# Patient Record
Sex: Male | Born: 1970 | Race: White | Hispanic: No | Smoking: Never smoker
Health system: Southern US, Community
[De-identification: ages and names within clinical notes are randomized; demographics above are authoritative.]

## PROBLEM LIST (undated history)

## (undated) DIAGNOSIS — F411 Generalized anxiety disorder: Secondary | ICD-10-CM

## (undated) HISTORY — DX: Generalized anxiety disorder: F41.1

---

## 2007-03-02 ENCOUNTER — Encounter: Admission: RE | Admit: 2007-03-02 | Discharge: 2007-03-02 | Payer: Self-pay | Admitting: Family Medicine

## 2009-01-07 IMAGING — CT CT HEAD W/O CM
1 series · 16 of 28 positions shown, 20 images · IV contrast (agent unspecified)
Comparison: None

CLINICAL DATA: PICC top of head 1-1/2 weeks ago. Persistent dizziness, blurry
vision, and speech difficulty.
TECHNIQUE: 5mm collimated images were obtained from the base of the skull
through the vertex according to standard protocol without contrast.

HEAD CT WITHOUT CONTRAST:

[Series 32: 3d filtered head · axial · 0.49mm/px · z∈[+12,+141]mm · 16 of 28 slices shown, 20 images]
[im 2/28  brain]
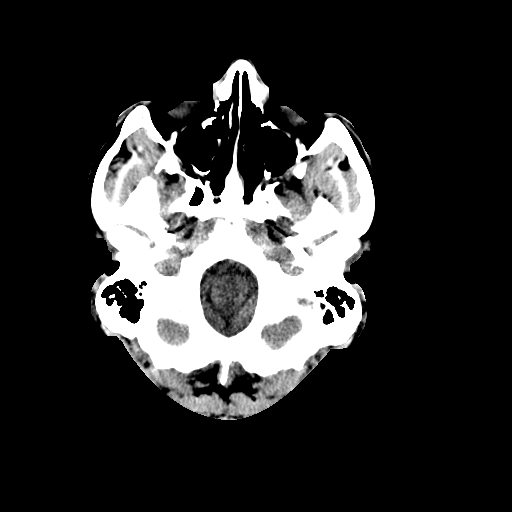
[im 2/28  bone]
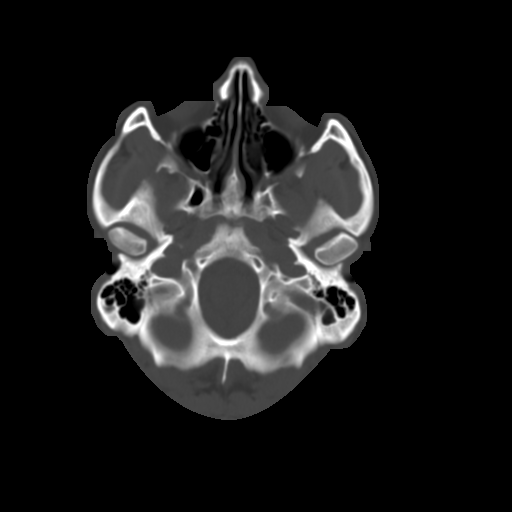
[im 4/28  brain]
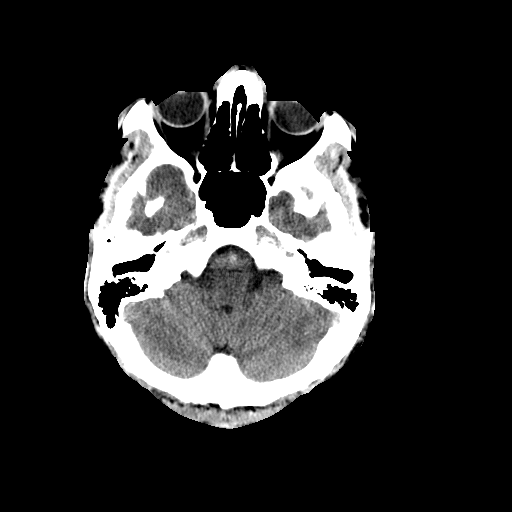
[im 6/28  brain]
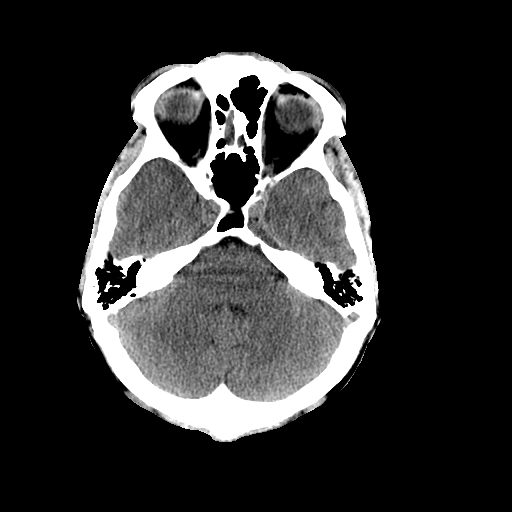
[im 7/28  brain]
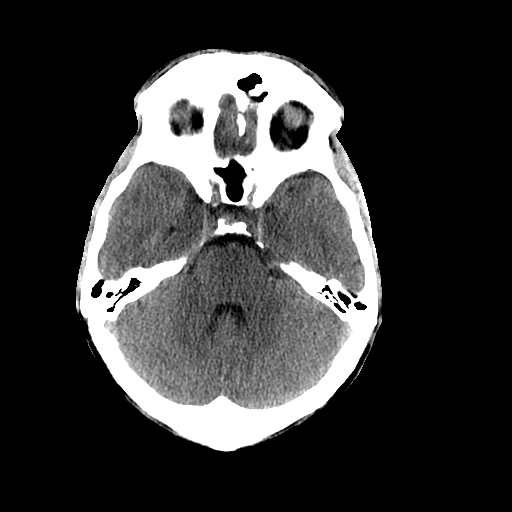
[im 9/28  brain]
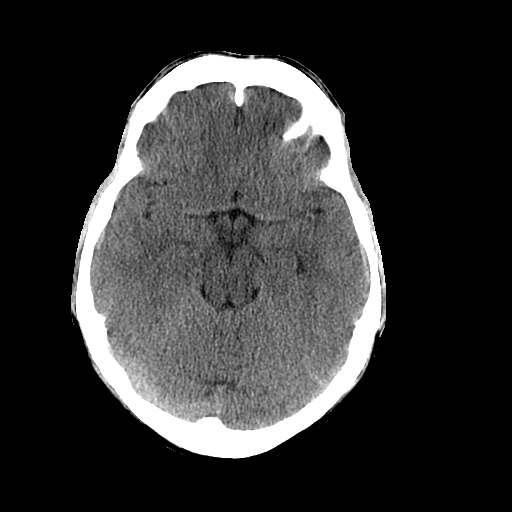
[im 9/28  bone]
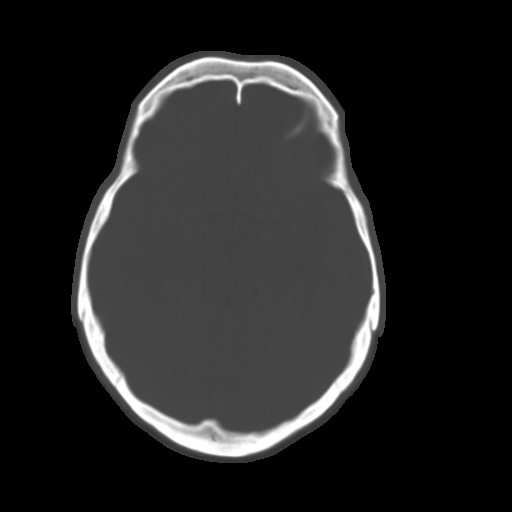
[im 10/28  brain]
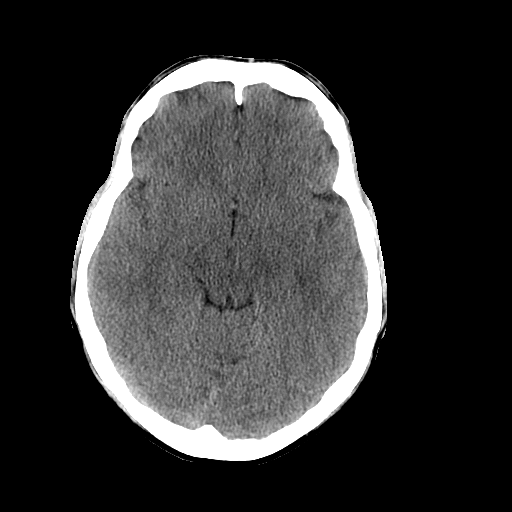
[im 12/28  brain]
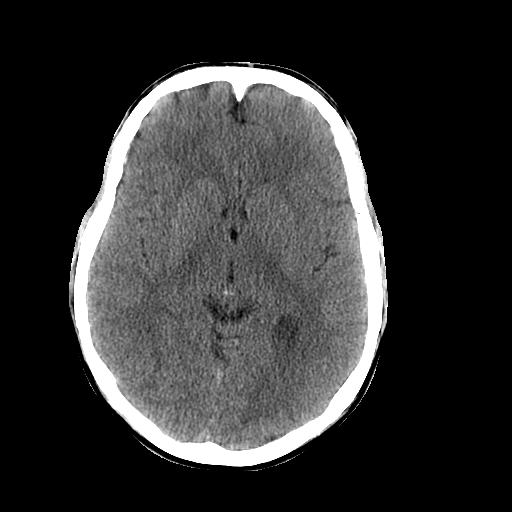
[im 14/28  brain]
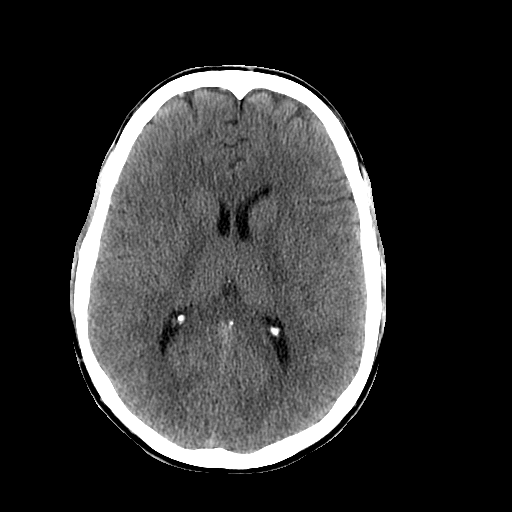
[im 15/28  brain]
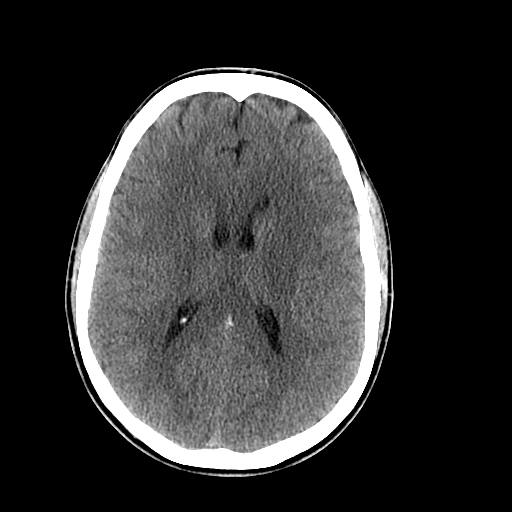
[im 15/28  bone]
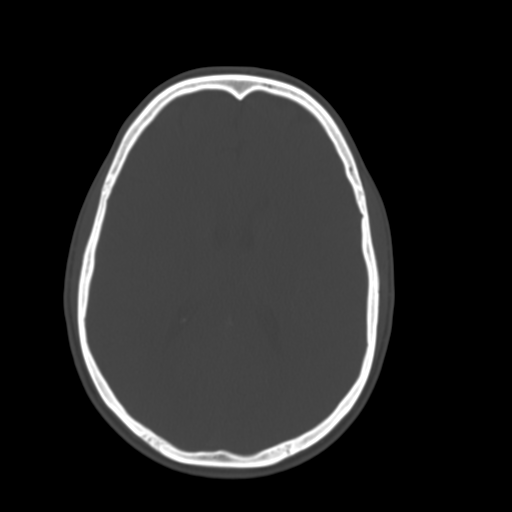
[im 17/28  brain]
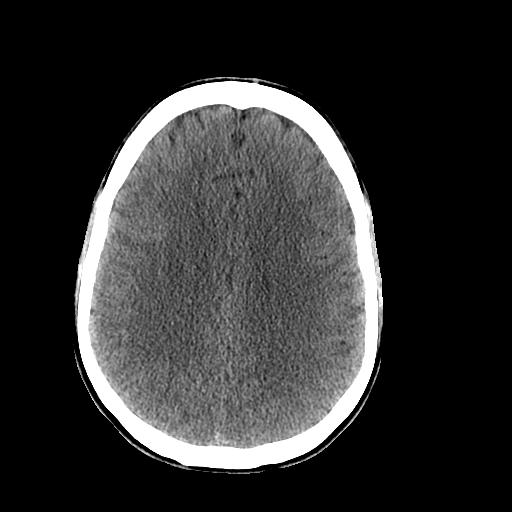
[im 19/28  brain]
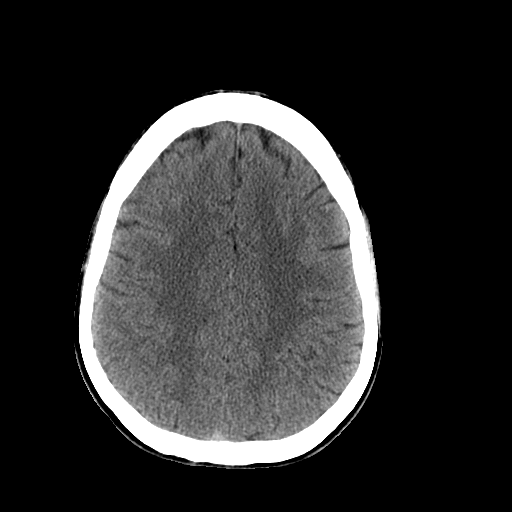
[im 20/28  brain]
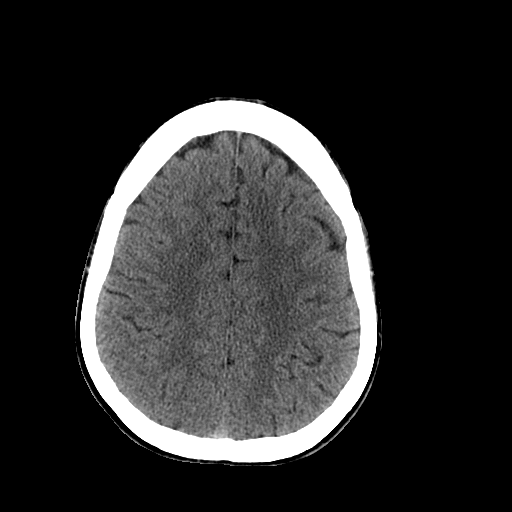
[im 22/28  brain]
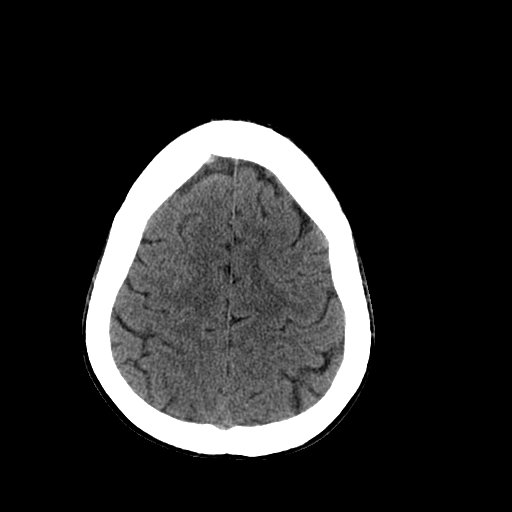
[im 22/28  bone]
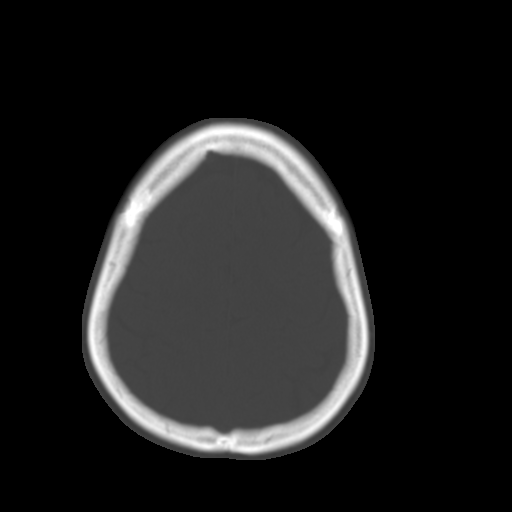
[im 23/28  brain]
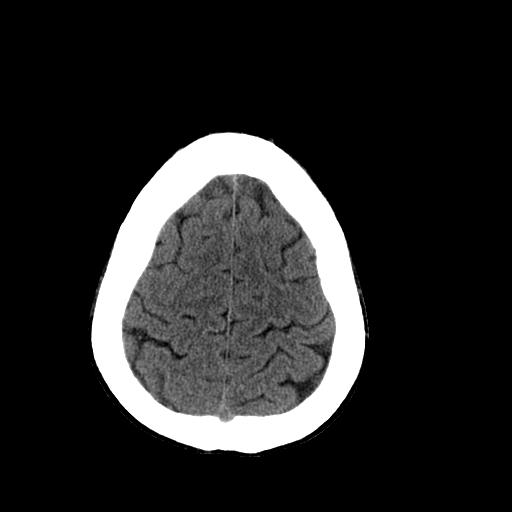
[im 25/28  brain]
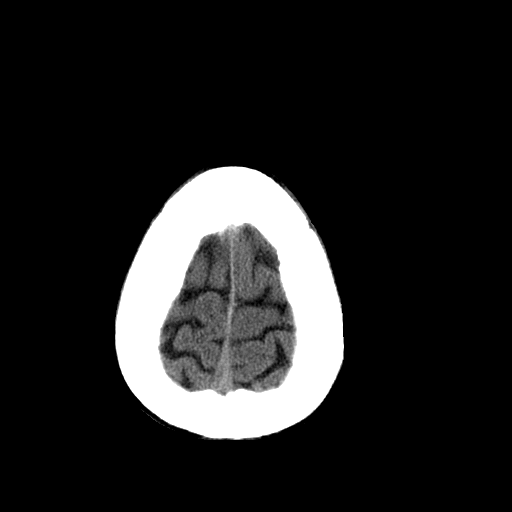
[im 27/28  brain]
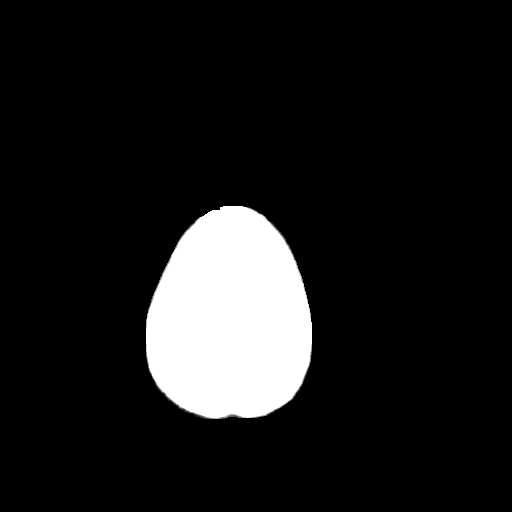

[16 of 28 positions shown; findings below may reference images not displayed]

FINDINGS: There is no evidence for acute hemorrhage, hydrocephalus, mass/mass-effect or
abnormal extra-axial fluid collection.  No definite CT evidence for acute
ischemia. Polypoid mucosal disease is seen in the left sphenoid sinus. The
remaining paranasal sinuses and mastoid air cells are clear.
IMPRESSION: No acute intracranial abnormality.

## 2013-11-17 ENCOUNTER — Ambulatory Visit (INDEPENDENT_AMBULATORY_CARE_PROVIDER_SITE_OTHER): Payer: BC Managed Care – PPO | Admitting: Family Medicine

## 2013-11-17 ENCOUNTER — Encounter: Payer: Self-pay | Admitting: Family Medicine

## 2013-11-17 VITALS — BP 147/89 | HR 77 | Ht 71.0 in | Wt 169.0 lb

## 2013-11-17 DIAGNOSIS — R030 Elevated blood-pressure reading, without diagnosis of hypertension: Secondary | ICD-10-CM

## 2013-11-17 DIAGNOSIS — Z13220 Encounter for screening for lipoid disorders: Secondary | ICD-10-CM

## 2013-11-17 DIAGNOSIS — Z131 Encounter for screening for diabetes mellitus: Secondary | ICD-10-CM

## 2013-11-17 DIAGNOSIS — L819 Disorder of pigmentation, unspecified: Secondary | ICD-10-CM

## 2013-11-17 DIAGNOSIS — IMO0001 Reserved for inherently not codable concepts without codable children: Secondary | ICD-10-CM

## 2013-11-17 DIAGNOSIS — F411 Generalized anxiety disorder: Secondary | ICD-10-CM

## 2013-11-17 HISTORY — DX: Generalized anxiety disorder: F41.1

## 2013-11-17 NOTE — Progress Notes (Signed)
CC: John CornDavid S Pollard is a 43 y.o. male is here for Establish Care   Subjective: HPI:  Very pleasant 43 year old here to establish care  Reports a history of anxiety that has been present for the past 5 years. Is described as mild in severity. It began after he consumed a large amount of beets and had what he thought was blood in his stool. Since then he would get occasional episodes of subjective anxiety however he is able to control this with yoga, and mindfulness meditation.  Symptoms are not currently interfering with quality of life  He has some growths on his face and the body that he would like to be evaluated by a dermatologist. No family or personal history of skin cancer. He's noticed some more pigmentation to some of growths he been present over the past 5 years but enlarging on a yearly basis. No interventions as of yet  Review of Systems - General ROS: negative for - chills, fever, night sweats, weight gain or weight loss Ophthalmic ROS: negative for - decreased vision Psychological ROS: negative for - anxiety or depression ENT ROS: negative for - hearing change, nasal congestion, tinnitus or allergies Hematological and Lymphatic ROS: negative for - bleeding problems, bruising or swollen lymph nodes Breast ROS: negative Respiratory ROS: no cough, shortness of breath, or wheezing Cardiovascular ROS: no chest pain or dyspnea on exertion Gastrointestinal ROS: no abdominal pain, change in bowel habits, or black or bloody stools Genito-Urinary ROS: negative for - genital discharge, genital ulcers, incontinence or abnormal bleeding from genitals Musculoskeletal ROS: negative for - joint pain or muscle pain Neurological ROS: negative for - headaches or memory loss Dermatological ROS: negative for lumps, mole changes, rash and skin lesion changes other than that described above  Past Medical History  Diagnosis Date  . Generalized anxiety disorder 11/17/2013    History reviewed. No  pertinent past surgical history. Family History  Problem Relation Age of Onset  . Heart attack Father   . Hyperlipidemia Father   . Hypertension Father     History   Social History  . Marital Status: Married    Spouse Name: N/A    Number of Children: N/A  . Years of Education: N/A   Occupational History  . Not on file.   Social History Main Topics  . Smoking status: Never Smoker   . Smokeless tobacco: Not on file  . Alcohol Use: 5.0 oz/week    10 drink(s) per week  . Drug Use: No  . Sexual Activity: Yes    Partners: Female   Other Topics Concern  . Not on file   Social History Narrative  . No narrative on file     Objective: BP 147/89  Pulse 77  Ht 5\' 11"  (1.803 m)  Wt 169 lb (76.658 kg)  BMI 23.58 kg/m2  General: Alert and Oriented, No Acute Distress HEENT: Pupils equal, round, reactive to light. Conjunctivae clear.  External ears unremarkable, canals clear with intact TMs with appropriate landmarks.  Middle ear appears open without effusion. Pink inferior turbinates.  Moist mucous membranes, pharynx without inflammation nor lesions.  Neck supple without palpable lymphadenopathy nor abnormal masses. Lungs: Clear to auscultation bilaterally, no wheezing/ronchi/rales.  Comfortable work of breathing. Good air movement. Cardiac: Regular rate and rhythm. Normal S1/S2.  No murmurs, rubs, nor gallops.   Extremities: No peripheral edema.  Strong peripheral pulses.  Mental Status: No depression, anxiety, nor agitation. Skin: Warm and dry. Noninflamed skin tags underneath the armpits, 2  flesh-colored growths on the face one above the left lip one just beneath the right ankle of the mandible this is the only one that appears to have irregular pigment pattern  Assessment & Plan: John Pollard was seen today for establish care.  Diagnoses and associated orders for this visit:  Elevated blood pressure - BASIC METABOLIC PANEL WITH GFR  Pigmented skin lesion - Ambulatory referral  to Dermatology  Lipid screening - Lipid panel  Diabetes mellitus screening - BASIC METABOLIC PANEL WITH GFR  Generalized anxiety disorder    Elevated blood pressure: Discussed DASH diet and physical activity to help produce the need for blood pressure medication in the near future. After he was resting a repeat blood pressure showed 130/70. Checking renal function Pigmented skin lesion: Dermatology referral per his request Generalized anxiety disorder: Controlled with lifestyle and exercise interventions no further interventions Due for routine dyslipidemia screening with a family history of cardiac disease    Return in about 1 year (around 11/18/2014).

## 2013-11-24 LAB — LIPID PANEL
CHOLESTEROL: 166 mg/dL (ref 0–200)
HDL: 57 mg/dL (ref >39)
LDL Cholesterol: 98 mg/dL (ref 0–99)
Total CHOL/HDL Ratio: 2.9 Ratio
Triglycerides: 55 mg/dL (ref <150)
VLDL: 11 mg/dL (ref 0–40)

## 2013-11-24 LAB — BASIC METABOLIC PANEL WITH GFR
BUN: 10 mg/dL (ref 6–23)
CO2: 27 mEq/L (ref 19–32)
Calcium: 8.8 mg/dL (ref 8.4–10.5)
Chloride: 103 mEq/L (ref 96–112)
Creat: 0.86 mg/dL (ref 0.50–1.35)
GFR, Est Non African American: >89 mL/min
Glucose, Bld: 85 mg/dL (ref 70–99)
POTASSIUM: 4 meq/L (ref 3.5–5.3)
Sodium: 139 mEq/L (ref 135–145)

## 2013-11-28 ENCOUNTER — Encounter: Payer: Self-pay | Admitting: Family Medicine

## 2013-12-14 ENCOUNTER — Encounter: Payer: Self-pay | Admitting: Family Medicine

## 2013-12-14 DIAGNOSIS — L989 Disorder of the skin and subcutaneous tissue, unspecified: Secondary | ICD-10-CM | POA: Insufficient documentation

## 2019-07-10 ENCOUNTER — Emergency Department (INDEPENDENT_AMBULATORY_CARE_PROVIDER_SITE_OTHER)
Admission: EM | Admit: 2019-07-10 | Discharge: 2019-07-10 | Disposition: A | Discharge status: Home / Self Care | Payer: 59 | Source: Home / Self Care | Attending: Emergency Medicine | Admitting: Emergency Medicine

## 2019-07-10 ENCOUNTER — Other Ambulatory Visit: Payer: Self-pay

## 2019-07-10 DIAGNOSIS — S0502XA Injury of conjunctiva and corneal abrasion without foreign body, left eye, initial encounter: Secondary | ICD-10-CM | POA: Diagnosis not present

## 2019-07-10 MED ORDER — GENTAMICIN SULFATE 0.3 % OP SOLN: 2.0000 [drp] | Freq: Four times a day (QID) | OPHTHALMIC | 0 refills | Status: DC

## 2019-07-10 NOTE — ED Provider Notes (Signed)
[facility] Pollard [facility]    CSN: [id number] Arrival date & time: 07/10/19  1546      History   Chief Complaint Chief Complaint  Patient presents with  . Eye Problem    HPI John Pollard is a 49 y.o. male.   HPI Complains of left eye pain and foreign body sensation when he blinks.  Started yesterday after accidentally poking left eye with a branch while doing yard work.  He feels it is constantly watering.  Pain can vary between 4 and 7 out of 10.  He feels vision in the left eye is slightly decreased because of watering but otherwise can see fairly well.  Denies any scotomas or problems with field of vision.  Denies any other head injury or other ENT symptoms or any loss of consciousness or focal neurologic symptoms. Review of systems otherwise negative. Denies prior eye problems.  No known drug allergies. Past Medical History:  Diagnosis Date  . Generalized anxiety disorder 11/17/2013    Patient Active Problem List   Diagnosis Date Noted  . Skin lesion 12/14/2013  . Generalized anxiety disorder 11/17/2013  . Elevated blood pressure 11/17/2013    History reviewed. No pertinent surgical history.     Home Medications    Prior to Admission medications   Medication Sig Start Date End Date Taking? Authorizing Provider  gentamicin (GARAMYCIN) 0.3 % ophthalmic solution Place 2 drops into the left eye 4 (four) times daily. X 7 days 07/10/19   Lajean Manes, MD    Family History Family History  Problem Relation Age of Onset  . Heart attack Father   . Hyperlipidemia Father   . Hypertension Father     Social History Social History   Tobacco Use  . Smoking status: Never Smoker  Substance Use Topics  . Alcohol use: Yes    Alcohol/week: 10.0 standard drinks    Types: 10 Standard drinks or equivalent per week  . Drug use: No     Allergies   Patient has no known allergies.   Review of Systems Review of Systems Pertinent items noted in HPI and remainder of  comprehensive ROS otherwise negative.   Physical Exam Triage Vital Signs ED Triage Vitals  Enc Vitals Group     BP 07/10/19 1601 (!) 161/89     Pulse Rate 07/10/19 1601 76     Resp 07/10/19 1601 18     Temp 07/10/19 1601 98.5 F (36.9 C)     Temp Source 07/10/19 1601 Oral     SpO2 07/10/19 1601 98 %     Weight 07/10/19 1602 170 lb (77.1 kg)     Height 07/10/19 1602 5\' 11"  (1.803 m)     Head Circumference --      Peak Flow --      Pain Score 07/10/19 1602 7     Pain Loc --      Pain Edu? --      Excl. in GC? --    No data found.  Updated Vital Signs BP (!) 161/89 (BP Location: Left Arm)   Pulse 76   Temp 98.5 F (36.9 C) (Oral)   Resp 18   Ht 5\' 11"  (1.803 m)   Wt 77.1 kg   SpO2 98%   BMI 23.71 kg/m   Visual Acuity Right Eye Distance:   20/15 Left Eye Distance:   20/30 Bilateral Distance:   20/20  Right Eye Near:    Left Eye Near:    Bilateral Near:  Physical Exam Vitals reviewed.  Constitutional:      General: He is not in acute distress (But uncomfortable from left eye pain).    Appearance: He is well-developed.  HENT:     Head: Normocephalic and atraumatic.     Mouth/Throat:     Pharynx: Oropharynx is clear.  Eyes:     General: Lids are normal. Lids are everted, no foreign bodies appreciated. No visual field deficit.       Right eye: No foreign body or discharge.        Left eye: No foreign body or discharge.     Extraocular Movements: Extraocular movements intact.     Conjunctiva/sclera:     Right eye: Right conjunctiva is not injected.     Left eye: Left conjunctiva is injected. No exudate or hemorrhage.    Pupils: Pupils are equal, round, and reactive to light.     Funduscopic exam:    Right eye: No papilledema.        Left eye: No papilledema.     Slit lamp exam:    Right eye: Anterior chamber quiet.     Left eye: Anterior chamber quiet.      Comments: 2 drops of tetracaine placed left eye with good anesthesia.  He stated his eye pain  improved after the tetracaine.   Using saline soaked Q-tip, lids everted, no foreign body seen or retrieved. Fluorescein stain showed deep corneal abrasion at 6:00 left eye.-I then irrigated with saline to remove the stain. Erythromycin ointment and double eye patch applied, secured with tape.-He tolerated all the above well and stated to his L eye pain was better .   Cardiovascular:     Rate and Rhythm: Normal rate and regular rhythm.  Pulmonary:     Effort: Pulmonary effort is normal.  Abdominal:     General: There is no distension.  Musculoskeletal:     Cervical back: Normal range of motion and neck supple.  Skin:    General: Skin is warm and dry.  Neurological:     Mental Status: He is alert and oriented to person, place, and time.     Cranial Nerves: No cranial nerve deficit.  Psychiatric:        Behavior: Behavior normal.      UC Treatments / Results  Labs (all labs ordered are listed, but only abnormal results are displayed) Labs Reviewed - No data to display  EKG   Radiology No results found.  Procedures Procedures (including critical care time)  Medications Ordered in UC Medications - No data to display  Initial Impression / Assessment and Plan / UC Course  I have reviewed the triage vital signs and the nursing notes.  Pertinent labs & imaging results that were available during my care of the patient were reviewed by me and considered in my medical decision making (see chart for details).     MDM: I explained the vital importance of following up with eye doctor in the next 1 to 2 days, as a deep corneal abrasion would be at risk for permanent vision problems if it was not allowed to heal properly. Questions invited and answered at length.  See discharge instructions below, given to patient.  He voiced understanding and agreement with plans.  Advised not to drive with patch on eye.  He called his wife who came to urgent care to drive him home.  Final  Clinical Impressions(s) / UC Diagnoses   Final diagnoses:  Abrasion of left  cornea, initial encounter     Discharge Instructions     You have a corneal abrasion left eye. Today, I examined your left eye including staining with fluorescein, which highlighted the corneal abrasion ,left eye. Then, I irrigated with saline.  No foreign body seen. I then placed erythromycin ointment and a double eye patch. Wear the L eye patch tonight, then remove tomorrow morning.-It is best for you not to drive with patch on your eye. I sent a prescription for eyedrops to your pharmacy to use 2 drops in the left eye 4 times a day starting tomorrow. You need to follow-up with eye doctor in 1 to 2 days to recheck to be sure that it is improved. Please also see attached instruction sheet on corneal abrasion.  I gave you a handout with the name and contact information of various eye doctors in [place].     ED Prescriptions    Medication Sig Dispense Auth. Provider   gentamicin (GARAMYCIN) 0.3 % ophthalmic solution Place 2 drops into the left eye 4 (four) times daily. X 7 days 5 mL Lajean Manes, MD      also advised to have BP rechecked with PCP   Lajean Manes, MD 07/10/19 2221

## 2019-07-10 NOTE — ED Triage Notes (Addendum)
Pt c/o LT eye swelling and redness. Says he poked it with a branch yesterday while doing yardwork. Also constant watering. Pain 7/10

## 2019-07-10 NOTE — Discharge Instructions (Addendum)
You have a corneal abrasion left eye. Today, I examined your left eye including staining with fluorescein, which highlighted the corneal abrasion ,left eye. Then, I irrigated with saline.  No foreign body seen. I then placed erythromycin ointment and a double eye patch. Wear the L eye patch tonight, then remove tomorrow morning.-It is best for you not to drive with patch on your eye. I sent a prescription for eyedrops to your pharmacy to use 2 drops in the left eye 4 times a day starting tomorrow. You need to follow-up with eye doctor in 1 to 2 days to recheck to be sure that it is improved. Please also see attached instruction sheet on corneal abrasion.  I gave you a handout with the name and contact information of various eye doctors in [place].

## 2020-08-06 ENCOUNTER — Ambulatory Visit: Payer: 59 | Admitting: Family Medicine

## 2020-08-13 ENCOUNTER — Ambulatory Visit (INDEPENDENT_AMBULATORY_CARE_PROVIDER_SITE_OTHER): Payer: 59 | Admitting: Family Medicine

## 2020-08-13 ENCOUNTER — Encounter: Payer: Self-pay | Admitting: Family Medicine

## 2020-08-13 ENCOUNTER — Other Ambulatory Visit: Payer: Self-pay

## 2020-08-13 VITALS — BP 147/95 | HR 68 | Temp 97.6°F | Ht 70.28 in | Wt 173.2 lb

## 2020-08-13 DIAGNOSIS — Z13220 Encounter for screening for lipoid disorders: Secondary | ICD-10-CM | POA: Diagnosis not present

## 2020-08-13 DIAGNOSIS — Z1211 Encounter for screening for malignant neoplasm of colon: Secondary | ICD-10-CM

## 2020-08-13 DIAGNOSIS — B360 Pityriasis versicolor: Secondary | ICD-10-CM | POA: Diagnosis not present

## 2020-08-13 DIAGNOSIS — Z0000 Encounter for general adult medical examination without abnormal findings: Secondary | ICD-10-CM | POA: Diagnosis not present

## 2020-08-13 MED ORDER — KETOCONAZOLE 2 % EX CREA: 1.0000 "application " | TOPICAL_CREAM | Freq: Every day | CUTANEOUS | 0 refills | Status: AC

## 2020-08-13 NOTE — Patient Instructions (Signed)

## 2020-08-13 NOTE — Assessment & Plan Note (Signed)
Well adult Orders Placed This Encounter  Procedures  . COMPLETE METABOLIC PANEL WITH GFR  . CBC with Differential  . Lipid Panel w/reflex Direct LDL  . Ambulatory referral to Gastroenterology    Referral Priority:   Routine    Referral Type:   Consultation    Referral Reason:   Specialty Services Required    Number of Visits Requested:   1  Screening:  Colonoscopy referral.  Immunizations: He thinks he may be up to date on Tdap, will hold off on this for now.  Anticipatory guidance/risk factor reduction:  Recommendations per AVS.

## 2020-08-13 NOTE — Progress Notes (Signed)
John Pollard - 50 y.o. male MRN [id number]  Date of birth: 10/12/1970  Subjective Chief Complaint  Patient presents with  . Establish Care    HPI John Pollard is a 50 y.o. male here today for initial visit and annual exam.  He has been in good health.  He denies any chronic medical problems and does not take any regular medications.   He has noted rash on his chest that he would like checked out.  Consists of some discolored patches.  Has some mild itchiness at times.    He has never had colon cancer screening.  He would like referral for colonoscopy.   He does exercise regularly and feels like his diet is pretty good.   He would like to have updated labs today.   Review of Systems  Constitutional: Negative for chills, fever, malaise/fatigue and weight loss.  HENT: Negative for congestion, ear pain and sore throat.   Eyes: Negative for blurred vision, double vision and pain.  Respiratory: Negative for cough and shortness of breath.   Cardiovascular: Negative for chest pain and palpitations.  Gastrointestinal: Negative for abdominal pain, blood in stool, constipation, heartburn and nausea.  Genitourinary: Negative for dysuria and urgency.  Musculoskeletal: Negative for joint pain and myalgias.  Neurological: Negative for dizziness and headaches.  Endo/Heme/Allergies: Does not bruise/bleed easily.  Psychiatric/Behavioral: Negative for depression. The patient is not nervous/anxious and does not have insomnia.     No Known Allergies  Past Medical History:  Diagnosis Date  . Generalized anxiety disorder 11/17/2013    History reviewed. No pertinent surgical history.  Social History   Socioeconomic History  . Marital status: Married    Spouse name: Not on file  . Number of children: Not on file  . Years of education: Not on file  . Highest education level: Not on file  Occupational History  . Not on file  Tobacco Use  . Smoking status: Never Smoker  . Smokeless  tobacco: Never Used  Vaping Use  . Vaping Use: Never used  Substance and Sexual Activity  . Alcohol use: Yes    Alcohol/week: 12.0 standard drinks    Types: 12 Cans of beer per week  . Drug use: No  . Sexual activity: Yes    Partners: Female  Other Topics Concern  . Not on file  Social History Narrative  . Not on file   Social Determinants of Health   Financial Resource Strain: Not on file  Food Insecurity: Not on file  Transportation Needs: Not on file  Physical Activity: Not on file  Stress: Not on file  Social Connections: Not on file    Family History  Problem Relation Age of Onset  . Heart attack Father   . Hyperlipidemia Father   . Hypertension Father   . Stroke Maternal Grandfather   . Stroke Paternal Uncle     Health Maintenance  Topic Date Due  . Hepatitis C Screening  Never done  . COVID-19 Vaccine (1) Never done  . HIV Screening  Never done  . COLONOSCOPY (Pts 45-45yrs Insurance coverage will need to be confirmed)  Never done  . TETANUS/TDAP  11/18/2018  . INFLUENZA VACCINE  12/09/2020  . HPV VACCINES  Aged Out     ----------------------------------------------------------------------------------------------------------------------------------------------------------------------------------------------------------------- Physical Exam BP (!) 147/95 (BP Location: Left Arm, Patient Position: Sitting, Cuff Size: Normal)   Pulse 68   Temp 97.6 F (36.4 C)   Ht 5' 10.28" (1.785 m)   Wt 173  lb 3.2 oz (78.6 kg)   SpO2 97%   BMI 24.66 kg/m   Physical Exam Constitutional:      General: He is not in acute distress. HENT:     Head: Normocephalic and atraumatic.     Right Ear: External ear normal.     Left Ear: External ear normal.  Eyes:     General: No scleral icterus. Neck:     Thyroid: No thyromegaly.  Cardiovascular:     Rate and Rhythm: Normal rate and regular rhythm.     Heart sounds: Normal heart sounds.  Pulmonary:     Effort: Pulmonary  effort is normal.     Breath sounds: Normal breath sounds.  Abdominal:     General: Bowel sounds are normal. There is no distension.     Palpations: Abdomen is soft.     Tenderness: There is no abdominal tenderness. There is no guarding.  Musculoskeletal:     Cervical back: Normal range of motion.     Comments: A few scattered hyperpigmented macules on chest  Lymphadenopathy:     Cervical: No cervical adenopathy.  Skin:    General: Skin is warm and dry.     Findings: No rash.  Neurological:     Mental Status: He is alert and oriented to person, place, and time.     Cranial Nerves: No cranial nerve deficit.     Motor: No abnormal muscle tone.  Psychiatric:        Behavior: Behavior normal.     ------------------------------------------------------------------------------------------------------------------------------------------------------------------------------------------------------------------- Assessment and Plan  Tinea versicolor Rash on chest consistent with tinea versicolor.  Start topical ketoconazole daily x2 weeks.    Well adult exam Well adult Orders Placed This Encounter  Procedures  . COMPLETE METABOLIC PANEL WITH GFR  . CBC with Differential  . Lipid Panel w/reflex Direct LDL  . Ambulatory referral to Gastroenterology    Referral Priority:   Routine    Referral Type:   Consultation    Referral Reason:   Specialty Services Required    Number of Visits Requested:   1  Screening:  Colonoscopy referral.  Immunizations: He thinks he may be up to date on Tdap, will hold off on this for now.  Anticipatory guidance/risk factor reduction:  Recommendations per AVS.     Meds ordered this encounter  Medications  . ketoconazole (NIZORAL) 2 % cream    Sig: Apply 1 application topically daily for 14 days.    Dispense:  14 g    Refill:  0    No follow-ups on file.    This visit occurred during the SARS-CoV-2 public health emergency.  Safety protocols were in  place, including screening questions prior to the visit, additional usage of staff PPE, and extensive cleaning of exam room while observing appropriate contact time as indicated for disinfecting solutions.

## 2020-08-13 NOTE — Assessment & Plan Note (Signed)
Rash on chest consistent with tinea versicolor.  Start topical ketoconazole daily x2 weeks.

## 2020-08-14 LAB — CBC WITH DIFFERENTIAL/PLATELET
Eosinophils Relative: 0.6 %
MCHC: 32.6 g/dL (ref 32.0–36.0)

## 2020-08-15 LAB — CBC WITH DIFFERENTIAL/PLATELET
Absolute Monocytes: 536 cells/uL (ref 200–950)
Basophils Absolute: 28 cells/uL (ref 0–200)
Basophils Relative: 0.6 %
Eosinophils Absolute: 28 cells/uL (ref 15–500)
HCT: 47.2 % (ref 38.5–50.0)
Hemoglobin: 15.4 g/dL (ref 13.2–17.1)
Lymphs Abs: 649 cells/uL — ABNORMAL LOW (ref 850–3900)
MCH: 29.7 pg (ref 27.0–33.0)
MCV: 91.1 fL (ref 80.0–100.0)
MPV: 12.2 fL (ref 7.5–12.5)
Monocytes Relative: 11.4 %
Neutro Abs: 3459 cells/uL (ref 1500–7800)
Neutrophils Relative %: 73.6 %
Platelets: 229 10*3/uL (ref 140–400)
RBC: 5.18 10*6/uL (ref 4.20–5.80)
RDW: 12.2 % (ref 11.0–15.0)
Total Lymphocyte: 13.8 %
WBC: 4.7 10*3/uL (ref 3.8–10.8)

## 2020-08-15 LAB — COMPLETE METABOLIC PANEL WITH GFR
AG Ratio: 1.6 (calc) (ref 1.0–2.5)
ALT: 16 U/L (ref 9–46)
AST: 21 U/L (ref 10–35)
Albumin: 4.6 g/dL (ref 3.6–5.1)
Alkaline phosphatase (APISO): 51 U/L (ref 35–144)
BUN: 16 mg/dL (ref 7–25)
CO2: 27 mmol/L (ref 20–32)
Calcium: 9.6 mg/dL (ref 8.6–10.3)
Chloride: 102 mmol/L (ref 98–110)
Creat: 0.89 mg/dL (ref 0.70–1.33)
GFR, Est African American: 116 mL/min/{1.73_m2} (ref >=60)
GFR, Est Non African American: 100 mL/min/{1.73_m2} (ref >=60)
Globulin: 2.8 g/dL (calc) (ref 1.9–3.7)
Glucose, Bld: 92 mg/dL (ref 65–99)
Potassium: 4.3 mmol/L (ref 3.5–5.3)
Sodium: 139 mmol/L (ref 135–146)
Total Bilirubin: 1.4 mg/dL — ABNORMAL HIGH (ref 0.2–1.2)
Total Protein: 7.4 g/dL (ref 6.1–8.1)

## 2020-08-15 LAB — LIPID PANEL W/REFLEX DIRECT LDL
Cholesterol: 205 mg/dL — ABNORMAL HIGH (ref <200)
HDL: 60 mg/dL (ref >=40)
LDL Cholesterol (Calc): 129 mg/dL (calc) — ABNORMAL HIGH
Non-HDL Cholesterol (Calc): 145 mg/dL (calc) — ABNORMAL HIGH (ref <130)
Total CHOL/HDL Ratio: 3.4 (calc) (ref <5.0)
Triglycerides: 66 mg/dL (ref <150)

## 2020-09-18 LAB — HM COLONOSCOPY

## 2020-10-03 ENCOUNTER — Encounter: Payer: Self-pay | Admitting: Family Medicine

## 2020-10-03 ENCOUNTER — Other Ambulatory Visit: Payer: Self-pay

## 2021-10-02 ENCOUNTER — Ambulatory Visit (INDEPENDENT_AMBULATORY_CARE_PROVIDER_SITE_OTHER): Payer: 59 | Admitting: Family Medicine

## 2021-10-02 ENCOUNTER — Encounter: Payer: Self-pay | Admitting: Family Medicine

## 2021-10-02 VITALS — BP 155/77 | HR 59 | Ht 70.28 in | Wt 173.0 lb

## 2021-10-02 DIAGNOSIS — R6882 Decreased libido: Secondary | ICD-10-CM

## 2021-10-02 DIAGNOSIS — Z13220 Encounter for screening for lipoid disorders: Secondary | ICD-10-CM | POA: Diagnosis not present

## 2021-10-02 DIAGNOSIS — Z23 Encounter for immunization: Secondary | ICD-10-CM | POA: Diagnosis not present

## 2021-10-02 DIAGNOSIS — R030 Elevated blood-pressure reading, without diagnosis of hypertension: Secondary | ICD-10-CM

## 2021-10-02 DIAGNOSIS — Z0000 Encounter for general adult medical examination without abnormal findings: Secondary | ICD-10-CM | POA: Diagnosis not present

## 2021-10-02 DIAGNOSIS — Z125 Encounter for screening for malignant neoplasm of prostate: Secondary | ICD-10-CM

## 2021-10-02 DIAGNOSIS — N529 Male erectile dysfunction, unspecified: Secondary | ICD-10-CM

## 2021-10-02 MED ORDER — TADALAFIL 20 MG PO TABS: 10.0000 mg | ORAL_TABLET | ORAL | 11 refills | Status: AC | PRN

## 2021-10-02 NOTE — Assessment & Plan Note (Signed)
Well adult Orders Placed This Encounter  Procedures  . Tdap vaccine greater than or equal to 52yo IM  . COMPLETE METABOLIC PANEL WITH GFR  . CBC with Differential  . Lipid Panel w/reflex Direct LDL  . TSH  . Testosterone  . PSA  Screening:  Per lab orders Immunizations: Tdap given today. He will get Shingrix at his local pharmacy.  Anticipatory guidance/Risk factor reduction:  Recommendations per AVS.

## 2021-10-02 NOTE — Assessment & Plan Note (Signed)
Checking testosterone and PSA.  Trial of tadalafil sent.  Side effects and red flags reviewed.

## 2021-10-02 NOTE — Patient Instructions (Signed)

## 2021-10-02 NOTE — Progress Notes (Signed)
John Pollard - 51 y.o. male MRN [id number]  Date of birth: [birth year]  Subjective Chief Complaint  Patient presents with   Annual Exam    HPI John Pollard is a 51 y.o. male here today for annual exam.  Overall he has been in good health.  Is in a new relationship and has noted some mild ED issues.  Interest and drive are normal.    Continues to stay pretty active.  Feels like diet is pretty good.   He denies EtOH use. He has a few beers each week.   He is due for Tdap and Shingrix.  He would like to go ahead and get Tdap today.     Review of Systems  Constitutional:  Negative for chills, fever, malaise/fatigue and weight loss.  HENT:  Negative for congestion, ear pain and sore throat.   Eyes:  Negative for blurred vision, double vision and pain.  Respiratory:  Negative for cough and shortness of breath.   Cardiovascular:  Negative for chest pain and palpitations.  Gastrointestinal:  Negative for abdominal pain, blood in stool, constipation, heartburn and nausea.  Genitourinary:  Negative for dysuria and urgency.  Musculoskeletal:  Negative for joint pain and myalgias.  Neurological:  Negative for dizziness and headaches.  Endo/Heme/Allergies:  Does not bruise/bleed easily.  Psychiatric/Behavioral:  Negative for depression. The patient is not nervous/anxious and does not have insomnia.    No Known Allergies  Past Medical History:  Diagnosis Date   Generalized anxiety disorder 11/17/2013    History reviewed. No pertinent surgical history.  Social History   Socioeconomic History   Marital status: Legally Separated    Spouse name: Not on file   Number of children: Not on file   Years of education: Not on file   Highest education level: Not on file  Occupational History   Not on file  Tobacco Use   Smoking status: Never   Smokeless tobacco: Never  Vaping Use   Vaping Use: Never used  Substance and Sexual Activity   Alcohol use: Yes    Alcohol/week: 12.0  standard drinks    Types: 12 Cans of beer per week   Drug use: No   Sexual activity: Yes    Partners: Female  Other Topics Concern   Not on file  Social History Narrative   Not on file   Social Determinants of Health   Financial Resource Strain: Not on file  Food Insecurity: Not on file  Transportation Needs: Not on file  Physical Activity: Not on file  Stress: Not on file  Social Connections: Not on file    Family History  Problem Relation Age of Onset   Heart attack Father    Hyperlipidemia Father    Hypertension Father    Stroke Maternal Grandfather    Stroke Paternal [place] Maintenance  Topic Date Due   TETANUS/TDAP  11/18/2018   COVID-19 Vaccine (4 - Booster for Moderna series) 01/09/2022 (Originally 05/22/2020)   Zoster Vaccines- Shingrix (1 of 2) 01/09/2022 (Originally 03/24/2020)   Hepatitis C Screening  10/03/2022 (Originally 03/24/1988)   HIV Screening  10/03/2022 (Originally 03/24/1985)   INFLUENZA VACCINE  12/09/2021   COLONOSCOPY (Pts 45-53yrs Insurance coverage will need to be confirmed)  09/19/2023   HPV VACCINES  Aged Out     ----------------------------------------------------------------------------------------------------------------------------------------------------------------------------------------------------------------- Physical Exam BP (!) 142/77 (BP Location: Left Arm, Patient Position: Sitting, Cuff Size: Small)   Pulse 68   Ht 5' 10.28" (1.785 m)  Wt 173 lb (78.5 kg)   SpO2 99%   BMI 24.63 kg/m   Physical Exam Constitutional:      General: He is not in acute distress. HENT:     Head: Normocephalic and atraumatic.     Right Ear: Tympanic membrane and external ear normal.     Left Ear: Tympanic membrane and external ear normal.  Eyes:     General: No scleral icterus. Neck:     Thyroid: No thyromegaly.  Cardiovascular:     Rate and Rhythm: Normal rate and regular rhythm.     Heart sounds: Normal heart sounds.   Pulmonary:     Effort: Pulmonary effort is normal.     Breath sounds: Normal breath sounds.  Abdominal:     General: Bowel sounds are normal. There is no distension.     Palpations: Abdomen is soft.     Tenderness: There is no abdominal tenderness. There is no guarding.  Musculoskeletal:     Cervical back: Normal range of motion.  Lymphadenopathy:     Cervical: No cervical adenopathy.  Skin:    General: Skin is warm and dry.     Findings: No rash.  Neurological:     Mental Status: He is alert and oriented to person, place, and time.     Cranial Nerves: No cranial nerve deficit.     Motor: No abnormal muscle tone.  Psychiatric:        Mood and Affect: Mood normal.        Behavior: Behavior normal.    ------------------------------------------------------------------------------------------------------------------------------------------------------------------------------------------------------------------- Assessment and Plan  Well adult exam Well adult Orders Placed This Encounter  Procedures   Tdap vaccine greater than or equal to 7yo IM   COMPLETE METABOLIC PANEL WITH GFR   CBC with Differential   Lipid Panel w/reflex Direct LDL   TSH   Testosterone   PSA  Screening:  Per lab orders Immunizations: Tdap given today. He will get Shingrix at his local pharmacy.  Anticipatory guidance/Risk factor reduction:  Recommendations per AVS.    ED (erectile dysfunction) Checking testosterone and PSA.  Trial of tadalafil sent.  Side effects and red flags reviewed.   Meds ordered this encounter  Medications   tadalafil (CIALIS) 20 MG tablet    Sig: Take 0.5-1 tablets (10-20 mg total) by mouth every other day as needed for erectile dysfunction.    Dispense:  10 tablet    Refill:  11    No follow-ups on file.    This visit occurred during the SARS-CoV-2 public health emergency.  Safety protocols were in place, including screening questions prior to the visit, additional  usage of staff PPE, and extensive cleaning of exam room while observing appropriate contact time as indicated for disinfecting solutions.

## 2021-10-03 LAB — CBC WITH DIFFERENTIAL/PLATELET
RBC: 4.93 10*6/uL (ref 4.20–5.80)
WBC: 4.6 10*3/uL (ref 3.8–10.8)

## 2021-10-04 LAB — COMPLETE METABOLIC PANEL WITH GFR
AG Ratio: 1.8 (calc) (ref 1.0–2.5)
ALT: 19 U/L (ref 9–46)
AST: 21 U/L (ref 10–35)
Albumin: 4.2 g/dL (ref 3.6–5.1)
Alkaline phosphatase (APISO): 48 U/L (ref 35–144)
BUN: 10 mg/dL (ref 7–25)
CO2: 28 mmol/L (ref 20–32)
Calcium: 9.2 mg/dL (ref 8.6–10.3)
Chloride: 105 mmol/L (ref 98–110)
Creat: 0.88 mg/dL (ref 0.70–1.30)
Globulin: 2.4 g/dL (calc) (ref 1.9–3.7)
Glucose, Bld: 85 mg/dL (ref 65–99)
Potassium: 4.2 mmol/L (ref 3.5–5.3)
Sodium: 141 mmol/L (ref 135–146)
Total Bilirubin: 1 mg/dL (ref 0.2–1.2)
Total Protein: 6.6 g/dL (ref 6.1–8.1)
eGFR: 104 mL/min/{1.73_m2} (ref >=60)

## 2021-10-04 LAB — LIPID PANEL W/REFLEX DIRECT LDL
Cholesterol: 181 mg/dL (ref <200)
HDL: 64 mg/dL (ref >=40)
LDL Cholesterol (Calc): 103 mg/dL (calc) — ABNORMAL HIGH
Non-HDL Cholesterol (Calc): 117 mg/dL (calc) (ref <130)
Total CHOL/HDL Ratio: 2.8 (calc) (ref <5.0)
Triglycerides: 48 mg/dL (ref <150)

## 2021-10-04 LAB — CBC WITH DIFFERENTIAL/PLATELET
Absolute Monocytes: 676 cells/uL (ref 200–950)
Basophils Absolute: 28 cells/uL (ref 0–200)
Basophils Relative: 0.6 %
Eosinophils Absolute: 69 cells/uL (ref 15–500)
Eosinophils Relative: 1.5 %
HCT: 45 % (ref 38.5–50.0)
Hemoglobin: 15.4 g/dL (ref 13.2–17.1)
Lymphs Abs: 722 cells/uL — ABNORMAL LOW (ref 850–3900)
MCH: 31.2 pg (ref 27.0–33.0)
MCHC: 34.2 g/dL (ref 32.0–36.0)
MCV: 91.3 fL (ref 80.0–100.0)
MPV: 12 fL (ref 7.5–12.5)
Monocytes Relative: 14.7 %
Neutro Abs: 3105 cells/uL (ref 1500–7800)
Neutrophils Relative %: 67.5 %
Platelets: 179 10*3/uL (ref 140–400)
RDW: 12.5 % (ref 11.0–15.0)
Total Lymphocyte: 15.7 %

## 2021-10-04 LAB — TESTOSTERONE: Testosterone: 897 ng/dL — ABNORMAL HIGH (ref 250–827)

## 2021-10-04 LAB — TSH: TSH: 1.47 mIU/L (ref 0.40–4.50)

## 2021-10-04 LAB — PSA: PSA: 0.5 ng/mL (ref <=4.00)

## 2021-10-14 ENCOUNTER — Encounter: Payer: Self-pay | Admitting: Family Medicine

## 2022-12-10 ENCOUNTER — Other Ambulatory Visit: Payer: Self-pay | Admitting: Family Medicine
